# Patient Record
Sex: Female | Born: 1990 | Hispanic: Refuse to answer | Marital: Single
Health system: Midwestern US, Community
[De-identification: ages and names within clinical notes are randomized; demographics above are authoritative.]

## PROBLEM LIST (undated history)

## (undated) HISTORY — PX: EYE SURGERY: SHX253

---
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
## 2012-01-03 ENCOUNTER — Telehealth: Payer: Self-pay | Admitting: Internal Medicine

## 2012-01-03 MED ORDER — NORGESTIM-ETH ESTRAD TRIPHASIC 0.18/0.215/0.25 MG-35 MCG PO TABS: 1.0000 | ORAL_TABLET | Freq: Every day | ORAL | Status: DC

## 2012-01-03 NOTE — Telephone Encounter (Signed)
Rx refill

## 2012-01-18 ENCOUNTER — Ambulatory Visit: Payer: 59 | Admitting: Internal Medicine

## 2012-03-21 ENCOUNTER — Ambulatory Visit (INDEPENDENT_AMBULATORY_CARE_PROVIDER_SITE_OTHER): Payer: 59 | Admitting: Internal Medicine

## 2012-03-21 ENCOUNTER — Other Ambulatory Visit: Payer: Self-pay | Admitting: *Deleted

## 2012-03-21 DIAGNOSIS — R300 Dysuria: Secondary | ICD-10-CM

## 2012-03-21 LAB — POCT URINALYSIS DIPSTICK
Bilirubin, UA: NEGATIVE
Glucose, UA: NEGATIVE
Nitrite, UA: NEGATIVE

## 2012-03-21 MED ORDER — SULFAMETHOXAZOLE-TRIMETHOPRIM 800-160 MG PO TABS: 1.0000 | ORAL_TABLET | Freq: Two times a day (BID) | ORAL | Status: AC

## 2012-03-21 MED ORDER — SULFAMETHOXAZOLE-TMP DS 800-160 MG PO TABS: 1.0000 | ORAL_TABLET | Freq: Two times a day (BID) | ORAL | Status: AC

## 2012-03-21 NOTE — Progress Notes (Signed)
  Subjective:    Patient ID: Barbara Tanner, female    DOB: Aug 24, 1991, 21 y.o.   MRN: [id number]  HPI  Nurse visit only  Review of Systems     Objective:   Physical Exam        Assessment & Plan:

## 2012-03-23 LAB — URINE CULTURE
Colony Count: NO GROWTH
Organism ID, Bacteria: NO GROWTH

## 2012-04-18 ENCOUNTER — Other Ambulatory Visit: Payer: Self-pay | Admitting: *Deleted

## 2012-04-18 ENCOUNTER — Other Ambulatory Visit (INDEPENDENT_AMBULATORY_CARE_PROVIDER_SITE_OTHER): Payer: 59 | Admitting: *Deleted

## 2012-04-18 DIAGNOSIS — N390 Urinary tract infection, site not specified: Secondary | ICD-10-CM

## 2012-04-18 LAB — POCT URINALYSIS DIPSTICK
Bilirubin, UA: NEGATIVE
Glucose, UA: NEGATIVE
Ketones, UA: NEGATIVE
Nitrite, UA: NEGATIVE
pH, UA: 6

## 2012-04-18 MED ORDER — CIPROFLOXACIN HCL 500 MG PO TABS: 500.0000 mg | ORAL_TABLET | Freq: Two times a day (BID) | ORAL | Status: AC

## 2012-04-20 ENCOUNTER — Ambulatory Visit (INDEPENDENT_AMBULATORY_CARE_PROVIDER_SITE_OTHER): Payer: 59 | Admitting: *Deleted

## 2012-04-20 DIAGNOSIS — N390 Urinary tract infection, site not specified: Secondary | ICD-10-CM

## 2012-04-20 LAB — URINE CULTURE

## 2012-08-01 ENCOUNTER — Ambulatory Visit (INDEPENDENT_AMBULATORY_CARE_PROVIDER_SITE_OTHER): Payer: 59 | Admitting: Family Medicine

## 2012-08-01 ENCOUNTER — Encounter: Payer: Self-pay | Admitting: Family Medicine

## 2012-08-01 VITALS — BP 97/60 | HR 63 | Ht 68.0 in | Wt 135.2 lb

## 2012-08-01 DIAGNOSIS — Z0000 Encounter for general adult medical examination without abnormal findings: Secondary | ICD-10-CM

## 2012-08-01 DIAGNOSIS — F411 Generalized anxiety disorder: Secondary | ICD-10-CM

## 2012-08-01 DIAGNOSIS — Z23 Encounter for immunization: Secondary | ICD-10-CM

## 2012-08-01 DIAGNOSIS — J45909 Unspecified asthma, uncomplicated: Secondary | ICD-10-CM

## 2012-08-01 DIAGNOSIS — Z7251 High risk heterosexual behavior: Secondary | ICD-10-CM

## 2012-08-01 DIAGNOSIS — F419 Anxiety disorder, unspecified: Secondary | ICD-10-CM

## 2012-08-01 MED ORDER — CLONAZEPAM 0.5 MG PO TABS: 0.5000 mg | ORAL_TABLET | Freq: Two times a day (BID) | ORAL | Status: DC | PRN

## 2012-08-01 MED ORDER — ALBUTEROL SULFATE HFA 108 (90 BASE) MCG/ACT IN AERS: 2.0000 | INHALATION_SPRAY | Freq: Four times a day (QID) | RESPIRATORY_TRACT | Status: DC | PRN

## 2012-08-01 MED ORDER — INFLUENZA VIRUS VACC SPLIT PF IM SUSP: 0.5000 mL | Freq: Once | INTRAMUSCULAR | Status: DC

## 2012-08-01 NOTE — Patient Instructions (Signed)
Anxiety and Panic Attacks Your caregiver has informed you that you are having an anxiety or panic attack. There may be many forms of this. Most of the time these attacks come suddenly and without warning. They come at any time of day, including periods of sleep, and at any time of life. They may be strong and unexplained. Although panic attacks are very scary, they are physically harmless. Sometimes the cause of your anxiety is not known. Anxiety is a protective mechanism of the body in its fight or flight mechanism. Most of these perceived danger situations are actually nonphysical situations (such as anxiety over losing a job). CAUSES  The causes of an anxiety or panic attack are many. Panic attacks may occur in otherwise healthy people given a certain set of circumstances. There may be a genetic cause for panic attacks. Some medications may also have anxiety as a side effect. SYMPTOMS  Some of the most common feelings are:  Intense terror.  Dizziness, feeling faint.  Hot and cold flashes.  Fear of going crazy.  Feelings that nothing is real.  Sweating.  Shaking.  Chest pain or a fast heartbeat (palpitations).  Smothering, choking sensations.  Feelings of impending doom and that death is near.  Tingling of extremities, this may be from over-breathing.  Altered reality (derealization).  Being detached from yourself (depersonalization). Several symptoms can be present to make up anxiety or panic attacks. DIAGNOSIS  The evaluation by your caregiver will depend on the type of symptoms you are experiencing. The diagnosis of anxiety or panic attack is made when no physical illness can be determined to be a cause of the symptoms. TREATMENT  Treatment to prevent anxiety and panic attacks may include:  Avoidance of circumstances that cause anxiety.  Reassurance and relaxation.  Regular exercise.  Relaxation therapies, such as yoga.  Psychotherapy with a psychiatrist or  therapist.  Avoidance of caffeine, alcohol and illegal drugs.  Prescribed medication. SEEK IMMEDIATE MEDICAL CARE IF:   You experience panic attack symptoms that are different than your usual symptoms.  You have any worsening or concerning symptoms. Document Released: 08/23/2005 Document Revised: 11/15/2011 Document Reviewed: 12/25/2009 [facility] Patient Information 2013 [place], [place]. Sexually Transmitted Disease Sexually transmitted disease (STD) refers to any infection that is passed from person to person during sexual activity. This may happen by way of saliva, semen, blood, vaginal mucus, or urine. Common STDs include:  Gonorrhea.  Chlamydia.  Syphilis.  HIV/AIDS.  Genital herpes.  Hepatitis B and C.  Trichomonas.  Human papillomavirus (HPV).  Pubic lice. CAUSES  An STD may be spread by bacteria, virus, or parasite. A person can get an STD by:  Sexual intercourse with an infected person.  Sharing sex toys with an infected person.  Sharing needles with an infected person.  Having intimate contact with the genitals, mouth, or rectal areas of an infected person. SYMPTOMS  Some people may not have any symptoms, but they can still pass the infection to others. Different STDs have different symptoms. Symptoms include:  Painful or bloody urination.  Pain in the pelvis, abdomen, vagina, anus, throat, or eyes.  Skin rash, itching, irritation, growths, or sores (lesions). These usually occur in the genital or anal area.  Abnormal vaginal discharge.  Penile discharge in men.  Soft, flesh-colored skin growths in the genital or anal area.  Fever.  Pain or bleeding during sexual intercourse.  Swollen glands in the groin area.  Yellow skin and eyes (jaundice). This is seen with hepatitis. DIAGNOSIS  To make a diagnosis, your caregiver may:  Take a medical history.  Perform a physical exam.  Take a specimen (culture) to be examined.  Examine a sample of  discharge under a microscope.  Perform blood tests.  Perform a Pap test, if this applies.  Perform a colposcopy.  Perform a laparoscopy. TREATMENT   Chlamydia, gonorrhea, trichomonas, and syphilis can be cured with antibiotic medicine.  Genital herpes, hepatitis, and HIV can be treated, but not cured, with prescribed medicines. The medicines will lessen the symptoms.  Genital warts from HPV can be treated with medicine or by freezing, burning (electrocautery), or surgery. Warts may come back.  HPV is a virus and cannot be cured with medicine or surgery.However, abnormal areas may be followed very closely by your caregiver and may be removed from the cervix, vagina, or vulva through office procedures or surgery. If your diagnosis is confirmed, your recent sexual partners need treatment. This is true even if they are symptom-free or have a negative culture or evaluation. They should not have sex until their caregiver says it is okay. HOME CARE INSTRUCTIONS  All sexual partners should be informed, tested, and treated for all STDs.  Take your antibiotics as directed. Finish them even if you start to feel better.  Only take over-the-counter or prescription medicines for pain, discomfort, or fever as directed by your caregiver.  Rest.  Eat a balanced diet and drink enough fluids to keep your urine clear or pale yellow.  Do not have sex until treatment is completed and you have followed up with your caregiver. STDs should be checked after treatment.  Keep all follow-up appointments, Pap tests, and blood tests as directed by your caregiver.  Only use latex condoms and water-soluble lubricants during sexual activity. Do not use petroleum jelly or oils.  Avoid alcohol and illegal drugs.  Get vaccinated for HPV and hepatitis. If you have not received these vaccines in the past, talk to your caregiver about whether one or both might be right for you.  Avoid risky sex practices that can  break the skin. The only way to avoid getting an STD is to avoid all sexual activity.Latex condoms and dental dams (for oral sex) will help lessen the risk of getting an STD, but will not completely eliminate the risk. SEEK MEDICAL CARE IF:   You have a fever.  You have any new or worsening symptoms. Document Released: 11/13/2002 Document Revised: 11/15/2011 Document Reviewed: 11/20/2010 [facility] Patient Information 2013 [place], [place]. Preventive Care for Adults, Female A healthy lifestyle and preventive care can promote health and wellness. Preventive health guidelines for women include the following key practices.  A routine yearly physical is a good way to check with your caregiver about your health and preventive screening. It is a chance to share any concerns and updates on your health, and to receive a thorough exam.  Visit your dentist for a routine exam and preventive care every 6 months. Brush your teeth twice a day and floss once a day. Good oral hygiene prevents tooth decay and gum disease.  The frequency of eye exams is based on your age, health, family medical history, use of contact lenses, and other factors. Follow your caregiver's recommendations for frequency of eye exams.  Eat a healthy diet. Foods like vegetables, fruits, whole grains, low-fat dairy products, and lean protein foods contain the nutrients you need without too many calories. Decrease your intake of foods high in solid fats, added sugars, and salt. Eat the  right amount of calories for you.Get information about a proper diet from your caregiver, if necessary.  Regular physical exercise is one of the most important things you can do for your health. Most adults should get at least 150 minutes of moderate-intensity exercise (any activity that increases your heart rate and causes you to sweat) each week. In addition, most adults need muscle-strengthening exercises on 2 or more days a week.  Maintain a healthy  weight. The body mass index (BMI) is a screening tool to identify possible weight problems. It provides an estimate of body fat based on height and weight. Your caregiver can help determine your BMI, and can help you achieve or maintain a healthy weight.For adults 20 years and older:  A BMI below 18.5 is considered underweight.  A BMI of 18.5 to 24.9 is normal.  A BMI of 25 to 29.9 is considered overweight.  A BMI of 30 and above is considered obese.  Maintain normal blood lipids and cholesterol levels by exercising and minimizing your intake of saturated fat. Eat a balanced diet with plenty of fruit and vegetables. Blood tests for lipids and cholesterol should begin at age 30 and be repeated every 5 years. If your lipid or cholesterol levels are high, you are over 50, or you are at high risk for heart disease, you may need your cholesterol levels checked more frequently.Ongoing high lipid and cholesterol levels should be treated with medicines if diet and exercise are not effective.  If you smoke, find out from your caregiver how to quit. If you do not use tobacco, do not start.  If you are pregnant, do not drink alcohol. If you are breastfeeding, be very cautious about drinking alcohol. If you are not pregnant and choose to drink alcohol, do not exceed 1 drink per day. One drink is considered to be 12 ounces (355 mL) of beer, 5 ounces (148 mL) of wine, or 1.5 ounces (44 mL) of liquor.  Avoid use of street drugs. Do not share needles with anyone. Ask for help if you need support or instructions about stopping the use of drugs.  High blood pressure causes heart disease and increases the risk of stroke. Your blood pressure should be checked at least every 1 to 2 years. Ongoing high blood pressure should be treated with medicines if weight loss and exercise are not effective.  If you are 41 to 19 years old, ask your caregiver if you should take aspirin to prevent strokes.  Diabetes screening  involves taking a blood sample to check your fasting blood sugar level. This should be done once every 3 years, after age 24, if you are within normal weight and without risk factors for diabetes. Testing should be considered at a younger age or be carried out more frequently if you are overweight and have at least 1 risk factor for diabetes.  Breast cancer screening is essential preventive care for women. You should practice "breast self-awareness." This means understanding the normal appearance and feel of your breasts and may include breast self-examination. Any changes detected, no matter how small, should be reported to a caregiver. Women in their 60s and 30s should have a clinical breast exam (CBE) by a caregiver as part of a regular health exam every 1 to 3 years. After age 61, women should have a CBE every year. Starting at age 33, women should consider having a mammography (breast X-ray test) every year. Women who have a family history of breast cancer should talk  to their caregiver about genetic screening. Women at a high risk of breast cancer should talk to their caregivers about having magnetic resonance imaging (MRI) and a mammography every year.  The Pap test is a screening test for cervical cancer. A Pap test can show cell changes on the cervix that might become cervical cancer if left untreated. A Pap test is a procedure in which cells are obtained and examined from the lower end of the uterus (cervix).  Women should have a Pap test starting at age 43.  Between ages 63 and 38, Pap tests should be repeated every 2 years.  Beginning at age 37, you should have a Pap test every 3 years as long as the past 3 Pap tests have been normal.  Some women have medical problems that increase the chance of getting cervical cancer. Talk to your caregiver about these problems. It is especially important to talk to your caregiver if a new problem develops soon after your last Pap test. In these cases, your  caregiver may recommend more frequent screening and Pap tests.  The above recommendations are the same for women who have or have not gotten the vaccine for human papillomavirus (HPV).  If you had a hysterectomy for a problem that was not cancer or a condition that could lead to cancer, then you no longer need Pap tests. Even if you no longer need a Pap test, a regular exam is a good idea to make sure no other problems are starting.  If you are between ages 65 and 28, and you have had normal Pap tests going back 10 years, you no longer need Pap tests. Even if you no longer need a Pap test, a regular exam is a good idea to make sure no other problems are starting.  If you have had past treatment for cervical cancer or a condition that could lead to cancer, you need Pap tests and screening for cancer for at least 20 years after your treatment.  If Pap tests have been discontinued, risk factors (such as a new sexual partner) need to be reassessed to determine if screening should be resumed.  The HPV test is an additional test that may be used for cervical cancer screening. The HPV test looks for the virus that can cause the cell changes on the cervix. The cells collected during the Pap test can be tested for HPV. The HPV test could be used to screen women aged 48 years and older, and should be used in women of any age who have unclear Pap test results. After the age of 99, women should have HPV testing at the same frequency as a Pap test.  Colorectal cancer can be detected and often prevented. Most routine colorectal cancer screening begins at the age of 27 and continues through age 41. However, your caregiver may recommend screening at an earlier age if you have risk factors for colon cancer. On a yearly basis, your caregiver may provide home test kits to check for hidden blood in the stool. Use of a small camera at the end of a tube, to directly examine the colon (sigmoidoscopy or colonoscopy), can  detect the earliest forms of colorectal cancer. Talk to your caregiver about this at age 36, when routine screening begins. Direct examination of the colon should be repeated every 5 to 10 years through age 61, unless early forms of pre-cancerous polyps or small growths are found.  Hepatitis C blood testing is recommended for all people born  from 1945 through 1965 and any individual with known risks for hepatitis C.  Practice safe sex. Use condoms and avoid high-risk sexual practices to reduce the spread of sexually transmitted infections (STIs). STIs include gonorrhea, chlamydia, syphilis, trichomonas, herpes, HPV, and human immunodeficiency virus (HIV). Herpes, HIV, and HPV are viral illnesses that have no cure. They can result in disability, cancer, and death. Sexually active women aged 38 and younger should be checked for chlamydia. Older women with new or multiple partners should also be tested for chlamydia. Testing for other STIs is recommended if you are sexually active and at increased risk.  Osteoporosis is a disease in which the bones lose minerals and strength with aging. This can result in serious bone fractures. The risk of osteoporosis can be identified using a bone density scan. Women ages 71 and over and women at risk for fractures or osteoporosis should discuss screening with their caregivers. Ask your caregiver whether you should take a calcium supplement or vitamin D to reduce the rate of osteoporosis.  Menopause can be associated with physical symptoms and risks. Hormone replacement therapy is available to decrease symptoms and risks. You should talk to your caregiver about whether hormone replacement therapy is right for you.  Use sunscreen with sun protection factor (SPF) of 30 or more. Apply sunscreen liberally and repeatedly throughout the day. You should seek shade when your shadow is shorter than you. Protect yourself by wearing long sleeves, pants, a wide-brimmed hat, and  sunglasses year round, whenever you are outdoors.  Once a month, do a whole body skin exam, using a mirror to look at the skin on your back. Notify your caregiver of new moles, moles that have irregular borders, moles that are larger than a pencil eraser, or moles that have changed in shape or color.  Stay current with required immunizations.  Influenza. You need a dose every fall (or winter). The composition of the flu vaccine changes each year, so being vaccinated once is not enough.  Pneumococcal polysaccharide. You need 1 to 2 doses if you smoke cigarettes or if you have certain chronic medical conditions. You need 1 dose at age 66 (or older) if you have never been vaccinated.  Tetanus, diphtheria, pertussis (Tdap, Td). Get 1 dose of Tdap vaccine if you are younger than age 109, are over 3 and have contact with an infant, are a Research scientist (physical sciences), are pregnant, or simply want to be protected from whooping cough. After that, you need a Td booster dose every 10 years. Consult your caregiver if you have not had at least 3 tetanus and diphtheria-containing shots sometime in your life or have a deep or dirty wound.  HPV. You need this vaccine if you are a woman age 69 or younger. The vaccine is given in 3 doses over 6 months.  Measles, mumps, rubella (MMR). You need at least 1 dose of MMR if you were born in 1957 or later. You may also need a second dose.  Meningococcal. If you are age 74 to 39 and a first-year college student living in a residence hall, or have one of several medical conditions, you need to get vaccinated against meningococcal disease. You may also need additional booster doses.  Zoster (shingles). If you are age 73 or older, you should get this vaccine.  Varicella (chickenpox). If you have never had chickenpox or you were vaccinated but received only 1 dose, talk to your caregiver to find out if you need this vaccine.  Hepatitis  A. You need this vaccine if you have a specific  risk factor for hepatitis A virus infection or you simply wish to be protected from this disease. The vaccine is usually given as 2 doses, 6 to 18 months apart.  Hepatitis B. You need this vaccine if you have a specific risk factor for hepatitis B virus infection or you simply wish to be protected from this disease. The vaccine is given in 3 doses, usually over 6 months. Preventive Services / Frequency Ages 63 to 36  Blood pressure check.** / Every 1 to 2 years.  Lipid and cholesterol check.** / Every 5 years beginning at age 22.  Clinical breast exam.** / Every 3 years for women in their 77s and 30s.  Pap test.** / Every 2 years from ages 32 through 16. Every 3 years starting at age 52 through age 63 or 85 with a history of 3 consecutive normal Pap tests.  HPV screening.** / Every 3 years from ages 42 through ages 3 to 52 with a history of 3 consecutive normal Pap tests.  Hepatitis C blood test.** / For any individual with known risks for hepatitis C.  Skin self-exam. / Monthly.  Influenza immunization.** / Every year.  Pneumococcal polysaccharide immunization.** / 1 to 2 doses if you smoke cigarettes or if you have certain chronic medical conditions.  Tetanus, diphtheria, pertussis (Tdap, Td) immunization. / A one-time dose of Tdap vaccine. After that, you need a Td booster dose every 10 years.  HPV immunization. / 3 doses over 6 months, if you are 85 and younger.  Measles, mumps, rubella (MMR) immunization. / You need at least 1 dose of MMR if you were born in 1957 or later. You may also need a second dose.  Meningococcal immunization. / 1 dose if you are age 13 to 10 and a first-year college student living in a residence hall, or have one of several medical conditions, you need to get vaccinated against meningococcal disease. You may also need additional booster doses.  Varicella immunization.** / Consult your caregiver.  Hepatitis A immunization.** / Consult your caregiver. 2  doses, 6 to 18 months apart.  Hepatitis B immunization.** / Consult your caregiver. 3 doses usually over 6 months. Ages 62 to 22  Blood pressure check.** / Every 1 to 2 years.  Lipid and cholesterol check.** / Every 5 years beginning at age 75.  Clinical breast exam.** / Every year after age 43.  Mammogram.** / Every year beginning at age 39 and continuing for as long as you are in good health. Consult with your caregiver.  Pap test.** / Every 3 years starting at age 54 through age 39 or 18 with a history of 3 consecutive normal Pap tests.  HPV screening.** / Every 3 years from ages 24 through ages 63 to 14 with a history of 3 consecutive normal Pap tests.  Fecal occult blood test (FOBT) of stool. / Every year beginning at age 55 and continuing until age 41. You may not need to do this test if you get a colonoscopy every 10 years.  Flexible sigmoidoscopy or colonoscopy.** / Every 5 years for a flexible sigmoidoscopy or every 10 years for a colonoscopy beginning at age 86 and continuing until age 25.  Hepatitis C blood test.** / For all people born from [birth year] through 1963 and any individual with known risks for hepatitis C.  Skin self-exam. / Monthly.  Influenza immunization.** / Every year.  Pneumococcal polysaccharide immunization.** / 1 to 2  doses if you smoke cigarettes or if you have certain chronic medical conditions.  Tetanus, diphtheria, pertussis (Tdap, Td) immunization.** / A one-time dose of Tdap vaccine. After that, you need a Td booster dose every 10 years.  Measles, mumps, rubella (MMR) immunization. / You need at least 1 dose of MMR if you were born in 1957 or later. You may also need a second dose.  Varicella immunization.** / Consult your caregiver.  Meningococcal immunization.** / Consult your caregiver.  Hepatitis A immunization.** / Consult your caregiver. 2 doses, 6 to 18 months apart.  Hepatitis B immunization.** / Consult your caregiver. 3 doses, usually  over 6 months. Ages 67 and over  Blood pressure check.** / Every 1 to 2 years.  Lipid and cholesterol check.** / Every 5 years beginning at age 18.  Clinical breast exam.** / Every year after age 38.  Mammogram.** / Every year beginning at age 7 and continuing for as long as you are in good health. Consult with your caregiver.  Pap test.** / Every 3 years starting at age 29 through age 63 or 79 with a 3 consecutive normal Pap tests. Testing can be stopped between 65 and 70 with 3 consecutive normal Pap tests and no abnormal Pap or HPV tests in the past 10 years.  HPV screening.** / Every 3 years from ages 69 through ages 50 or 16 with a history of 3 consecutive normal Pap tests. Testing can be stopped between 65 and 70 with 3 consecutive normal Pap tests and no abnormal Pap or HPV tests in the past 10 years.  Fecal occult blood test (FOBT) of stool. / Every year beginning at age 63 and continuing until age 73. You may not need to do this test if you get a colonoscopy every 10 years.  Flexible sigmoidoscopy or colonoscopy.** / Every 5 years for a flexible sigmoidoscopy or every 10 years for a colonoscopy beginning at age 55 and continuing until age 21.  Hepatitis C blood test.** / For all people born from [birth year] through 1963 and any individual with known risks for hepatitis C.  Osteoporosis screening.** / A one-time screening for women ages 47 and over and women at risk for fractures or osteoporosis.  Skin self-exam. / Monthly.  Influenza immunization.** / Every year.  Pneumococcal polysaccharide immunization.** / 1 dose at age 67 (or older) if you have never been vaccinated.  Tetanus, diphtheria, pertussis (Tdap, Td) immunization. / A one-time dose of Tdap vaccine if you are over 65 and have contact with an infant, are a Research scientist (physical sciences), or simply want to be protected from whooping cough. After that, you need a Td booster dose every 10 years.  Varicella immunization.** / Consult your  caregiver.  Meningococcal immunization.** / Consult your caregiver.  Hepatitis A immunization.** / Consult your caregiver. 2 doses, 6 to 18 months apart.  Hepatitis B immunization.** / Check with your caregiver. 3 doses, usually over 6 months. ** Family history and personal history of risk and conditions may change your caregiver's recommendations. Document Released: 10/19/2001 Document Revised: 11/15/2011 Document Reviewed: 01/18/2011 [facility] Patient Information 2013 [place], [place].

## 2012-08-01 NOTE — Progress Notes (Signed)
Feeling light headed, once a week she will feel like this and almost pass out.

## 2012-08-01 NOTE — Progress Notes (Signed)
  Subjective:     Barbara Tanner is a 21 y.o. female and is here for a comprehensive physical exam. The patient reports problems - having problems with dizziness, SOB, chest pain.  Has long h/o anxiety, strong family h/o anxiety, some OCD behavior.  History   Social History  . Marital Status: Single    Spouse Name: N/A    Number of Children: 0  . Years of Education: N/A   Occupational History  . [employer] - Programmer, systems    Social History Main Topics  . Smoking status: Never Smoker   . Smokeless tobacco: Never Used  . Alcohol Use: Yes     Comment: Every weekend  . Drug Use: No  . Sexually Active: Yes    Birth Control/ Protection: Pill   Other Topics Concern  . Not on file   Social History Narrative   Lives in [place] with parents and brother, graduated from [employer]. Works Engineering geologist.Regular Exercise -  Yes - cheerleading Daily Caffeine Use:  NO   Health Maintenance  Topic Date Due  . Chlamydia Screening  02/01/2008  . Pap Smear  02/01/2011  . Influenza Vaccine  05/07/2012  . Tetanus/tdap  10/06/2021    The following portions of the patient's history were reviewed and updated as appropriate: allergies, current medications, past family history, past medical history, past social history, past surgical history and problem list.  Review of Systems A comprehensive review of systems was negative.   Objective:    BP 97/60  Pulse 63  Ht 5\' 8"  (1.727 m)  Wt 135 lb 4 oz (61.349 kg)  BMI 20.56 kg/m2  LMP 07/18/2012 General appearance: alert, cooperative and appears stated age Head: Normocephalic, without obvious abnormality, atraumatic Neck: no adenopathy, supple, symmetrical, trachea midline and thyroid not enlarged, symmetric, no tenderness/mass/nodules Back: symmetric, no curvature. ROM normal. No CVA tenderness. Lungs: clear to auscultation bilaterally Heart: regular rate and rhythm, S1, S2 normal, no murmur, click, rub or gallop Abdomen: soft,  non-tender; bowel sounds normal; no masses,  no organomegaly Extremities: extremities normal, atraumatic, no cyanosis or edema Pulses: 2+ and symmetric Skin: Skin color, texture, turgor normal. No rashes or lesions Lymph nodes: Cervical, supraclavicular, and axillary nodes normal. Neurologic: Grossly normal    Assessment:    Healthy female exam. Anxiety disorder with panic attacks.     Plan:    Flu shot STD testing Psychology referral Annual labs without lipids, given age < 20. See After Visit Summary for Counseling Recommendations

## 2012-08-02 LAB — GC/CHLAMYDIA PROBE AMP, URINE
Chlamydia, Swab/Urine, PCR: NEGATIVE
GC Probe Amp, Urine: NEGATIVE

## 2012-08-02 LAB — CBC
HCT: 38 % (ref 36.0–46.0)
Hemoglobin: 13.1 g/dL (ref 12.0–15.0)
MCV: 83.3 fL (ref 78.0–100.0)
RBC: 4.56 MIL/uL (ref 3.87–5.11)
WBC: 6.1 10*3/uL (ref 4.0–10.5)

## 2012-08-02 LAB — COMPREHENSIVE METABOLIC PANEL
BUN: 9 mg/dL (ref 6–23)
CO2: 28 mEq/L (ref 19–32)
Calcium: 9.7 mg/dL (ref 8.4–10.5)
Chloride: 104 mEq/L (ref 96–112)
Creat: 0.61 mg/dL (ref 0.50–1.10)
Total Bilirubin: 0.3 mg/dL (ref 0.3–1.2)

## 2012-08-02 LAB — HIV ANTIBODY (ROUTINE TESTING W REFLEX): HIV: NONREACTIVE

## 2012-10-09 ENCOUNTER — Ambulatory Visit: Payer: 59 | Admitting: Internal Medicine

## 2012-10-10 ENCOUNTER — Ambulatory Visit: Payer: 59 | Admitting: Internal Medicine

## 2012-11-27 ENCOUNTER — Other Ambulatory Visit: Payer: Self-pay | Admitting: Internal Medicine

## 2012-11-29 ENCOUNTER — Ambulatory Visit: Payer: 59 | Admitting: Internal Medicine

## 2012-11-30 ENCOUNTER — Ambulatory Visit: Payer: 59 | Admitting: Internal Medicine

## 2013-05-03 ENCOUNTER — Telehealth: Payer: Self-pay | Admitting: *Deleted

## 2013-05-03 ENCOUNTER — Ambulatory Visit (INDEPENDENT_AMBULATORY_CARE_PROVIDER_SITE_OTHER): Payer: 59 | Admitting: Psychiatry

## 2013-05-03 DIAGNOSIS — F410 Panic disorder [episodic paroxysmal anxiety] without agoraphobia: Secondary | ICD-10-CM

## 2013-05-03 DIAGNOSIS — F411 Generalized anxiety disorder: Secondary | ICD-10-CM

## 2013-05-03 DIAGNOSIS — F419 Anxiety disorder, unspecified: Secondary | ICD-10-CM

## 2013-05-03 MED ORDER — CLONAZEPAM 0.5 MG PO TABS: 0.5000 mg | ORAL_TABLET | Freq: Two times a day (BID) | ORAL | Status: DC | PRN

## 2013-05-03 NOTE — Telephone Encounter (Signed)
rx called into [facility] [address].

## 2013-05-03 NOTE — Telephone Encounter (Signed)
I am ok with refill--needs to be called in.

## 2013-05-03 NOTE — Addendum Note (Signed)
Addended by: Reva Bores on: 05/03/2013 03:39 PM   Modules accepted: Orders

## 2013-05-03 NOTE — Telephone Encounter (Signed)
Patient is requesting refill of klonipin.  Last given rx in November 2013.  She did go to see therapist in [place] today but she is unable to write prescriptions, she said she would be more than happy to talk with the physician to coordinate if need be.  She saw Gene Noe Gens, her number is [phone].

## 2013-05-17 ENCOUNTER — Ambulatory Visit: Payer: 59 | Admitting: Psychiatry

## 2013-05-24 ENCOUNTER — Ambulatory Visit (INDEPENDENT_AMBULATORY_CARE_PROVIDER_SITE_OTHER): Payer: 59 | Admitting: Psychiatry

## 2013-05-24 DIAGNOSIS — F410 Panic disorder [episodic paroxysmal anxiety] without agoraphobia: Secondary | ICD-10-CM

## 2013-05-24 DIAGNOSIS — F411 Generalized anxiety disorder: Secondary | ICD-10-CM

## 2013-11-27 ENCOUNTER — Other Ambulatory Visit: Payer: Self-pay | Admitting: Family Medicine

## 2013-11-28 NOTE — Telephone Encounter (Signed)
Medication called [facility] for patient.

## 2014-12-17 ENCOUNTER — Encounter: Payer: Self-pay | Admitting: Physician Assistant

## 2014-12-17 ENCOUNTER — Ambulatory Visit (INDEPENDENT_AMBULATORY_CARE_PROVIDER_SITE_OTHER): Payer: 59 | Admitting: Physician Assistant

## 2014-12-17 VITALS — BP 113/71 | HR 75 | Ht 68.0 in | Wt 135.0 lb

## 2014-12-17 DIAGNOSIS — G43109 Migraine with aura, not intractable, without status migrainosus: Secondary | ICD-10-CM | POA: Diagnosis not present

## 2014-12-17 DIAGNOSIS — Z3009 Encounter for other general counseling and advice on contraception: Secondary | ICD-10-CM | POA: Diagnosis not present

## 2014-12-17 DIAGNOSIS — B373 Candidiasis of vulva and vagina: Secondary | ICD-10-CM | POA: Diagnosis not present

## 2014-12-17 DIAGNOSIS — N898 Other specified noninflammatory disorders of vagina: Secondary | ICD-10-CM

## 2014-12-17 DIAGNOSIS — B3731 Acute candidiasis of vulva and vagina: Secondary | ICD-10-CM | POA: Insufficient documentation

## 2014-12-17 MED ORDER — FLUCONAZOLE 150 MG PO TABS: 150.0000 mg | ORAL_TABLET | Freq: Every day | ORAL | Status: AC

## 2014-12-17 NOTE — Patient Instructions (Signed)
Contraception Choices Contraception (birth control) is the use of any methods or devices to prevent pregnancy. Below are some methods to help avoid pregnancy. HORMONAL METHODS   Contraceptive implant. This is a thin, plastic tube containing progesterone hormone. It does not contain estrogen hormone. Your health care provider inserts the tube in the inner part of the upper arm. The tube can remain in place for up to 3 years. After 3 years, the implant must be removed. The implant prevents the ovaries from releasing an egg (ovulation), thickens the cervical mucus to prevent sperm from entering the uterus, and thins the lining of the inside of the uterus.  Progesterone-only injections. These injections are given every 3 months by your health care provider to prevent pregnancy. This synthetic progesterone hormone stops the ovaries from releasing eggs. It also thickens cervical mucus and changes the uterine lining. This makes it harder for sperm to survive in the uterus.  Birth control pills. These pills contain estrogen and progesterone hormone. They work by preventing the ovaries from releasing eggs (ovulation). They also cause the cervical mucus to thicken, preventing the sperm from entering the uterus. Birth control pills are prescribed by a health care provider.Birth control pills can also be used to treat heavy periods.  Minipill. This type of birth control pill contains only the progesterone hormone. They are taken every day of each month and must be prescribed by your health care provider.  Birth control patch. The patch contains hormones similar to those in birth control pills. It must be changed once a week and is prescribed by a health care provider.  Vaginal ring. The ring contains hormones similar to those in birth control pills. It is left in the vagina for 3 weeks, removed for 1 week, and then a new one is put back in place. The patient must be comfortable inserting and removing the ring  from the vagina.A health care provider's prescription is necessary.  Emergency contraception. Emergency contraceptives prevent pregnancy after unprotected sexual intercourse. This pill can be taken right after sex or up to 5 days after unprotected sex. It is most effective the sooner you take the pills after having sexual intercourse. Most emergency contraceptive pills are available without a prescription. Check with your pharmacist. Do not use emergency contraception as your only form of birth control. BARRIER METHODS   Female condom. This is a thin sheath (latex or rubber) that is worn over the penis during sexual intercourse. It can be used with spermicide to increase effectiveness.  Female condom. This is a soft, loose-fitting sheath that is put into the vagina before sexual intercourse.  Diaphragm. This is a soft, latex, dome-shaped barrier that must be fitted by a health care provider. It is inserted into the vagina, along with a spermicidal jelly. It is inserted before intercourse. The diaphragm should be left in the vagina for 6 to 8 hours after intercourse.  Cervical cap. This is a round, soft, latex or plastic cup that fits over the cervix and must be fitted by a health care provider. The cap can be left in place for up to 48 hours after intercourse.  Sponge. This is a soft, circular piece of polyurethane foam. The sponge has spermicide in it. It is inserted into the vagina after wetting it and before sexual intercourse.  Spermicides. These are chemicals that kill or block sperm from entering the cervix and uterus. They come in the form of creams, jellies, suppositories, foam, or tablets. They do not require a   prescription. They are inserted into the vagina with an applicator before having sexual intercourse. The process must be repeated every time you have sexual intercourse. INTRAUTERINE CONTRACEPTION  Intrauterine device (IUD). This is a T-shaped device that is put in a woman's uterus  during a menstrual period to prevent pregnancy. There are 2 types:  Copper IUD. This type of IUD is wrapped in copper wire and is placed inside the uterus. Copper makes the uterus and fallopian tubes produce a fluid that kills sperm. It can stay in place for 10 years.  Hormone IUD. This type of IUD contains the hormone progestin (synthetic progesterone). The hormone thickens the cervical mucus and prevents sperm from entering the uterus, and it also thins the uterine lining to prevent implantation of a fertilized egg. The hormone can weaken or kill the sperm that get into the uterus. It can stay in place for 3-5 years, depending on which type of IUD is used. PERMANENT METHODS OF CONTRACEPTION  Female tubal ligation. This is when the woman's fallopian tubes are surgically sealed, tied, or blocked to prevent the egg from traveling to the uterus.  Hysteroscopic sterilization. This involves placing a small coil or insert into each fallopian tube. Your doctor uses a technique called hysteroscopy to do the procedure. The device causes scar tissue to form. This results in permanent blockage of the fallopian tubes, so the sperm cannot fertilize the egg. It takes about 3 months after the procedure for the tubes to become blocked. You must use another form of birth control for these 3 months.  Female sterilization. This is when the female has the tubes that carry sperm tied off (vasectomy).This blocks sperm from entering the vagina during sexual intercourse. After the procedure, the man can still ejaculate fluid (semen). NATURAL PLANNING METHODS  Natural family planning. This is not having sexual intercourse or using a barrier method (condom, diaphragm, cervical cap) on days the woman could become pregnant.  Calendar method. This is keeping track of the length of each menstrual cycle and identifying when you are fertile.  Ovulation method. This is avoiding sexual intercourse during ovulation.  Symptothermal  method. This is avoiding sexual intercourse during ovulation, using a thermometer and ovulation symptoms.  Post-ovulation method. This is timing sexual intercourse after you have ovulated. Regardless of which type or method of contraception you choose, it is important that you use condoms to protect against the transmission of sexually transmitted infections (STIs). Talk with your health care provider about which form of contraception is most appropriate for you. Document Released: 08/23/2005 Document Revised: 08/28/2013 Document Reviewed: 02/15/2013 ExitCare Patient Information 2015 [employer]. This information is not intended to replace advice given to you by your health care provider. Make sure you discuss any questions you have with your health care provider.  

## 2014-12-17 NOTE — Progress Notes (Signed)
Patient ID: Barbara Tanner, female   DOB: 12-03-1990, 24 y.o.   MRN: [id number] History:  Barbara Tanner is a 24 y.o. G0P0 who presents to clinic today for eval of vaginal discharge and discussion of contraception and headaches.  Her headaches have been much improved x 6 months with 100% gluten free diet.  She has had 28 headache FREE days in the last 28 days.  HIT6 score is 52.  She was recently discontinued from her TriSprintec contraceptive when her provider learned she has migraine with aura.  She desires contraception at this time and is uncertain when she would like to seek conception.  She notes her partner is several years older than she.   Her vaginal discharge is white with some itching.  She notes that she often does not feel "dry."  She is concerned she may have a yeast infection for the first time in many years.  She declines STD testing. Periods are regular with minimal bleeding and very light cramping.    The following portions of the patient's history were reviewed and updated as appropriate: allergies, current medications, past family history, past medical history, past social history, past surgical history and problem list.  Review of Systems:  She denies SOB, CP, headache, fever, weakness, nausea, vomiting, abdominal pain, dysuria.    Objective:  Physical Exam BP 113/71 mmHg  Pulse 75  Ht 5\' 8"  (1.727 m)  Wt 135 lb (61.236 kg)  BMI 20.53 kg/m2  LMP 11/26/2014 GENERAL: Well-developed, well-nourished female in no acute distress.  HEENT: Normocephalic, atraumatic.  LUNGS: Normal rate. No respiratory distress.    HEART: Regular rate  ABDOMEN: Soft, nontender, nondistended. PELVIC: Normal external female genitalia. Vagina is pink and rugated.  Mod amt of white, clumpy discharge. Normal cervix contour.  EXTREMITIES: No cyanosis, clubbing, or edema  Labs and Imaging No results found.  Assessment & Plan:  Assessment: Vaginal discharge Vulvovaginal candidiasis Migraine with  aura Contraceptive planning  Plans: Long discussion regarding contraceptive choices.  Will use non-hormonal or progesterone-only methods.  Pt seems to be leaning toward Mirena but will take home info and decide later.  She may make appt at her convenience.  She is to use condoms for all IC for now.  NO estrogen due to aura. Diflucan x 1 Continue gluten free diet for HA prevention.  RTC as needed/as convenient for contraception.  Time spent with patient: 45 minutes  Bertram DenverKaren E Teague Clark, New JerseyPA-C 12/17/2014 10:33 AM

## 2014-12-18 LAB — WET PREP, GENITAL
CLUE CELLS WET PREP: NONE SEEN
Trich, Wet Prep: NONE SEEN
Yeast Wet Prep HPF POC: NONE SEEN

## 2015-01-30 ENCOUNTER — Telehealth: Payer: Self-pay | Admitting: *Deleted

## 2015-01-30 ENCOUNTER — Other Ambulatory Visit: Payer: Self-pay | Admitting: Primary Care

## 2015-01-30 NOTE — Telephone Encounter (Signed)
Patient is calling to see if we can give her a prescription for singulair for her allergies.  I advised patient that we have not prescribed this for her in the past and she would need to contact a primary care physician.

## 2017-02-28 ENCOUNTER — Ambulatory Visit: Admit: 2017-02-28 | Discharge: 2017-02-28 | Payer: PRIVATE HEALTH INSURANCE | Attending: Family

## 2017-02-28 ENCOUNTER — Ambulatory Visit: Attending: Family

## 2017-02-28 DIAGNOSIS — F411 Generalized anxiety disorder: Secondary | ICD-10-CM

## 2017-02-28 NOTE — Progress Notes (Signed)
1. Have you been to the ER, urgent care clinic or hospitalized since your last visit? NO.     2. Have you seen or consulted any other health care providers outside of the [facility] since your last visit (Include any pap smears or colon screening)? NO      Do you have an Advanced Directive? NO    Would you like information on Advanced Directives? NO

## 2017-02-28 NOTE — Progress Notes (Signed)
Kelly Copeland is a 26 y.o. Caucasian female and presents with    Chief Complaint   Patient presents with   ??? Establish Care   ??? Irregular Heart Beat     Patient here for irregular heart beat.  Patient reports just starting a new job and she doesnt know if it is stress related but she feels like her heart beat stop and then start up again very fast for about 5 seconds and she gets shortness of breathe, lightheaded and dizzy that last about 10-15 minutes. Patient reports this happening about 3 times in the last 25 days.        Subjective:  HPI   Kelly Copeland presents today to establish care. She is pending insurance currently. She is working two jobs. She has a history of migraines.    She is with concerns today of heart palpitations, shortness of breath, and lightheaded/dizziness and a one time episode of feeling like going to black out that lasted about a half a second. She reports has been occurring for about a month now. She reports the symptoms last for about 10-15 minutes at a time. She states first epidose occured the first week of June when she started training for a new job, she was in bed resting, the second episode occurring the following week, she was sitting on the couch watching tv, drinking a glass of wine, the third episode was while she was driving home from [place]. from employment training, denies driving through a tunnel, over a bridge. Reports has a history of travel anxiety underlying, states ideal vacation is to stay home. During the third episode, she reports felt like she was going to pass out, had to pull over, however she knows she was worked up due to traveling by car alone, gps was acting up. She reports lack of sleep during travel for employment.     History of migraines with aura, photosensivity, numbness from head to toe, can't remember things/people/unable to speak during the episode. She describes the vision as only able to see half of object, numbness starts  at the finger tips, then to face/mouth, then to toes/legs-however this is not every episode, then the head ache occurs lasting about 24 hours. When younger would get n/v, states now without gi symptoms. She is always concerned if a migraine is going to occur. Last occurrence was last year. She attributes this to going to a gluten free diet about 3 years ago. Prior to the diet change she would experience episodes every month to every other month. She has tried all modalities of medicine for migraines and reports side effects. She was followed in the past neurologist, pediatric. Migraine specialist, MRI in past. Report [facility], mother had when younger and grew out of them. Triggers: She reports has noticed occurs around period, lack of or too much sleep, caffeine.     Additional Concerns: none     ROS   Review of Systems   Constitutional: Negative.    HENT: Negative.    Eyes: Negative.    Respiratory: Negative.    Cardiovascular: Negative.    Gastrointestinal: Negative.    Genitourinary: Negative.    Musculoskeletal: Negative.    Skin: Negative.    Neurological: Negative.    Psychiatric/Behavioral: Negative for depression, hallucinations, memory loss, substance abuse and suicidal ideas. The patient is nervous/anxious. The patient does not have insomnia.        Allergies   Allergen Reactions   ??? Gluten Nausea Only  Social History     Social History   ??? Marital status: SINGLE     Spouse name: N/A   ??? Number of children: N/A   ??? Years of education: N/A     Occupational History   ??? Not on file.     Social History Main Topics   ??? Smoking status: Never Smoker   ??? Smokeless tobacco: Never Used   ??? Alcohol use Yes      Comment: Socially   ??? Drug use: No   ??? Sexual activity: Yes     Partners: Male     Birth control/ protection: None     Other Topics Concern   ??? Not on file     Social History Narrative   ??? No narrative on file       Past Medical History:   Diagnosis Date   ??? Headache     migraines        Past Surgical History:   Procedure Laterality Date   ??? HX ORTHOPAEDIC      right foot surgery       Family History   Problem Relation Age of Onset   ??? No Known Problems Mother    ??? Hypertension Father    ??? Prostate Cancer Father    ??? Cancer Father      skin cancer   ??? Anxiety Father    ??? No Known Problems Sister    ??? Anxiety Brother    ??? No Known Problems Brother        Objective:  Vitals:    02/28/17 1245   BP: 116/74   Pulse: 76   Resp: 14   Temp: 98.5 ??F (36.9 ??C)   TempSrc: Oral   SpO2: 97%   Weight: 143 lb (64.9 kg)   Height: 5' 7.5" (1.715 m)   PainSc:   0 - No pain   LMP: 01/30/2017       LABS   No results found for this or any previous visit.    TESTS  none    PE  Physical Exam   Constitutional: She is oriented to person, place, and time. She appears well-developed and well-nourished. No distress.   HENT:   Head: Normocephalic and atraumatic.   Right Ear: External ear normal.   Left Ear: External ear normal.   Nose: Nose normal.   Mouth/Throat: Oropharynx is clear and moist. No oropharyngeal exudate.   Mild OME bilaterally  Posterior oropharynx mild erythema  No stones noted to tonsils  Raspy voice- states chronic   Eyes: Conjunctivae and EOM are normal. Pupils are equal, round, and reactive to light. Right eye exhibits no discharge. Left eye exhibits no discharge.   Neck: Normal range of motion. Neck supple. No thyromegaly present.   Cardiovascular: Normal rate, regular rhythm, normal heart sounds and intact distal pulses.    No murmur heard.  Pulmonary/Chest: Effort normal and breath sounds normal. No respiratory distress. She has no wheezes. She has no rales. She exhibits no tenderness.   Abdominal: Soft. Bowel sounds are normal.   Musculoskeletal: Normal range of motion. She exhibits no edema, tenderness or deformity.   Lymphadenopathy:     She has no cervical adenopathy.   Neurological: She is alert and oriented to person, place, and time. She  has normal reflexes. No cranial nerve deficit. Coordination normal.   Skin: Skin is warm and dry. No rash noted. She is not diaphoretic. No erythema. No pallor.   Psychiatric: She  has a normal mood and affect. Her behavior is normal. Judgment and thought content normal.   Vitals reviewed.      Assessment/Plan:    1. Establish care- CPE today, labs pending insurance.     2. Anxiety/Panic disorder/possible undiagnosed ADHD or ADD- Seems related to chronic history of migraines and situational such as with travel/change in environment. She is without insurance today but to be in effect as of August 1. Discussed some pharmacologic treatment however the patient is against pharmacology at this time as she reports with worry of what might cause migraine to trigger as with multiple side effects when trialing migraine management. I feel at this time her best plan of care is psychology as it would be best to find out if ADHD/ADD are underlying, she also wonders if she has a form of dyslexia. In the future I would like to refer her to psychology and occupational therapy? When insurance is effective I would like baseline labs and EKG.     3. Migraines- she has seen a dramatic change in migraines with the change in diet to gluten free. She is not followed by neurology at this time. She does not want to take medication for her migraines. I did suggest when insurance is active the need for preventative screening for cervical cancer with pap smear and discussion of birth control for possible increase in migraines management if this is possible as she is with high risk for stroke related to aura. However she has noted hormonal pattern in relation to migraines and does want BC prevention. Referral to Gynecology in future. Current plan to monitor.     Lab review: no lab studies available for review at time of visit    Today's Visit:   Diagnoses and all orders for this visit:    1. Generalized anxiety disorder    2. Panic attack     Health Maintenance: Will address when insurance is active August.    I have discussed the diagnosis with the patient and the intended plan as seen in the above orders.  The patient has received an after-visit summary and questions were answered concerning future plans.  I have discussed medication side effects and warnings with the patient as well. I have reviewed the plan of care with the patient, accepted their input and they are in agreement with the treatment goals.       Follow-up Disposition: Not on File   More than 1/2 of this 60 minute visit was spent in counseling and coordination of care, as described above.    Ramon Dredge, FNP-C  [facility]  [address]  [place], [place] [address]  Phone: [phone]  Fax: [phone]

## 2017-03-01 DIAGNOSIS — F411 Generalized anxiety disorder: Secondary | ICD-10-CM

## 2019-07-30 NOTE — Telephone Encounter (Signed)
Patient called to request a refill on Advair and Singulair. Not seen in this office according to [facility]. I called her and left a msg to schedule an appt. Requested a return call.
